# Patient Record
Sex: Female | Born: 1970 | Race: White | Hispanic: No | Marital: Married | Smoking: Former smoker
Health system: Southern US, Community
[De-identification: ages and names within clinical notes are randomized; demographics above are authoritative.]

## PROBLEM LIST (undated history)

## (undated) DIAGNOSIS — F988 Other specified behavioral and emotional disorders with onset usually occurring in childhood and adolescence: Secondary | ICD-10-CM

## (undated) HISTORY — DX: Other specified behavioral and emotional disorders with onset usually occurring in childhood and adolescence: F98.8

## (undated) HISTORY — PX: TONSILLECTOMY: SUR1361

---

## 1999-02-21 ENCOUNTER — Encounter: Payer: Self-pay | Admitting: Emergency Medicine

## 1999-02-21 ENCOUNTER — Emergency Department (HOSPITAL_COMMUNITY): Admission: EM | Admit: 1999-02-21 | Discharge: 1999-02-21 | Payer: Self-pay | Admitting: Emergency Medicine

## 1999-02-22 ENCOUNTER — Emergency Department (HOSPITAL_COMMUNITY): Admission: EM | Admit: 1999-02-22 | Discharge: 1999-02-22 | Payer: Self-pay | Admitting: Emergency Medicine

## 1999-10-07 ENCOUNTER — Other Ambulatory Visit: Admission: RE | Admit: 1999-10-07 | Discharge: 1999-10-07 | Payer: Self-pay | Admitting: Obstetrics and Gynecology

## 1999-11-08 ENCOUNTER — Other Ambulatory Visit: Admission: RE | Admit: 1999-11-08 | Discharge: 1999-11-08 | Payer: Self-pay | Admitting: Obstetrics and Gynecology

## 2000-04-19 ENCOUNTER — Other Ambulatory Visit: Admission: RE | Admit: 2000-04-19 | Discharge: 2000-04-19 | Payer: Self-pay | Admitting: Obstetrics and Gynecology

## 2000-11-03 ENCOUNTER — Other Ambulatory Visit: Admission: RE | Admit: 2000-11-03 | Discharge: 2000-11-03 | Payer: Self-pay | Admitting: *Deleted

## 2002-11-14 ENCOUNTER — Other Ambulatory Visit: Admission: RE | Admit: 2002-11-14 | Discharge: 2002-11-14 | Payer: Self-pay | Admitting: Obstetrics and Gynecology

## 2003-12-12 ENCOUNTER — Other Ambulatory Visit: Admission: RE | Admit: 2003-12-12 | Discharge: 2003-12-12 | Payer: Self-pay | Admitting: Obstetrics and Gynecology

## 2004-11-28 ENCOUNTER — Ambulatory Visit: Payer: Self-pay | Admitting: Internal Medicine

## 2005-03-06 ENCOUNTER — Ambulatory Visit: Payer: Self-pay | Admitting: Internal Medicine

## 2006-04-08 ENCOUNTER — Ambulatory Visit: Payer: Self-pay | Admitting: Internal Medicine

## 2006-04-09 ENCOUNTER — Ambulatory Visit: Payer: Self-pay | Admitting: Internal Medicine

## 2006-04-15 ENCOUNTER — Ambulatory Visit: Payer: Self-pay | Admitting: Internal Medicine

## 2006-09-28 ENCOUNTER — Ambulatory Visit (HOSPITAL_COMMUNITY): Admission: RE | Admit: 2006-09-28 | Discharge: 2006-09-28 | Payer: Self-pay | Admitting: Obstetrics and Gynecology

## 2006-10-29 ENCOUNTER — Inpatient Hospital Stay (HOSPITAL_COMMUNITY): Admission: AD | Admit: 2006-10-29 | Discharge: 2006-10-29 | Payer: Self-pay | Admitting: Obstetrics and Gynecology

## 2007-02-06 ENCOUNTER — Inpatient Hospital Stay (HOSPITAL_COMMUNITY): Admission: AD | Admit: 2007-02-06 | Discharge: 2007-02-06 | Payer: Self-pay | Admitting: Obstetrics and Gynecology

## 2007-08-19 ENCOUNTER — Encounter: Payer: Self-pay | Admitting: Internal Medicine

## 2007-10-13 ENCOUNTER — Ambulatory Visit: Payer: Self-pay | Admitting: Internal Medicine

## 2007-10-13 ENCOUNTER — Encounter: Payer: Self-pay | Admitting: Internal Medicine

## 2007-10-13 DIAGNOSIS — F988 Other specified behavioral and emotional disorders with onset usually occurring in childhood and adolescence: Secondary | ICD-10-CM | POA: Insufficient documentation

## 2007-10-17 LAB — CONVERTED CEMR LAB
ALT: 14 units/L (ref 0–35)
AST: 24 units/L (ref 0–37)
Albumin: 3.7 g/dL (ref 3.5–5.2)
Alkaline Phosphatase: 54 units/L (ref 39–117)
BUN: 4 mg/dL — ABNORMAL LOW (ref 6–23)
Bacteria, UA: NEGATIVE
Basophils Absolute: 0 10*3/uL (ref 0.0–0.1)
Basophils Relative: 0.6 % (ref 0.0–1.0)
Bilirubin Urine: NEGATIVE
Bilirubin, Direct: 0.1 mg/dL (ref 0.0–0.3)
CO2: 32 meq/L (ref 19–32)
Calcium: 9.2 mg/dL (ref 8.4–10.5)
Chloride: 102 meq/L (ref 96–112)
Creatinine, Ser: 0.6 mg/dL (ref 0.4–1.2)
Crystals: NEGATIVE
Eosinophils Absolute: 0.1 10*3/uL (ref 0.0–0.6)
Eosinophils Relative: 1.9 % (ref 0.0–5.0)
GFR calc Af Amer: 147 mL/min
GFR calc non Af Amer: 122 mL/min
Glucose, Bld: 101 mg/dL — ABNORMAL HIGH (ref 70–99)
HCT: 40.5 % (ref 36.0–46.0)
Hemoglobin, Urine: NEGATIVE
Hemoglobin: 14.1 g/dL (ref 12.0–15.0)
Ketones, ur: NEGATIVE mg/dL
Leukocytes, UA: NEGATIVE
Lymphocytes Relative: 34.9 % (ref 12.0–46.0)
MCHC: 34.9 g/dL (ref 30.0–36.0)
MCV: 90 fL (ref 78.0–100.0)
Monocytes Absolute: 0.5 10*3/uL (ref 0.2–0.7)
Monocytes Relative: 10.1 % (ref 3.0–11.0)
Mucus, UA: NEGATIVE
Neutro Abs: 2.6 10*3/uL (ref 1.4–7.7)
Neutrophils Relative %: 52.5 % (ref 43.0–77.0)
Nitrite: NEGATIVE
Platelets: 226 10*3/uL (ref 150–400)
Potassium: 4 meq/L (ref 3.5–5.1)
RBC: 4.51 M/uL (ref 3.87–5.11)
RDW: 12.2 % (ref 11.5–14.6)
Sodium: 140 meq/L (ref 135–145)
Specific Gravity, Urine: 1.01 (ref 1.000–1.03)
TSH: 1.5 microintl units/mL (ref 0.35–5.50)
Total Bilirubin: 0.6 mg/dL (ref 0.3–1.2)
Total Protein, Urine: NEGATIVE mg/dL
Total Protein: 6.7 g/dL (ref 6.0–8.3)
Urine Glucose: NEGATIVE mg/dL
Urobilinogen, UA: 0.2 (ref 0.0–1.0)
WBC: 4.9 10*3/uL (ref 4.5–10.5)
pH: 7.5 (ref 5.0–8.0)

## 2008-04-16 IMAGING — RF DG HYSTEROGRAM
6 series · 6 of 6 positions shown · IV contrast (omnipaque)
Comparison: none

CLINICAL DATA: Infertility.  
HYSTEROSALPINGOGRAM:
TECHNIQUE: Following cleansing of the cervix and vagina with Betadine, a hysterosalpingogram catheter was placed into the endocervical canal.  Hysterosalpingogram was then performed using 20 CC Omnipaque 300 contrast.

[Series 1: run · 1 of 1 slices shown (1 of 6)]
[im 1/1]
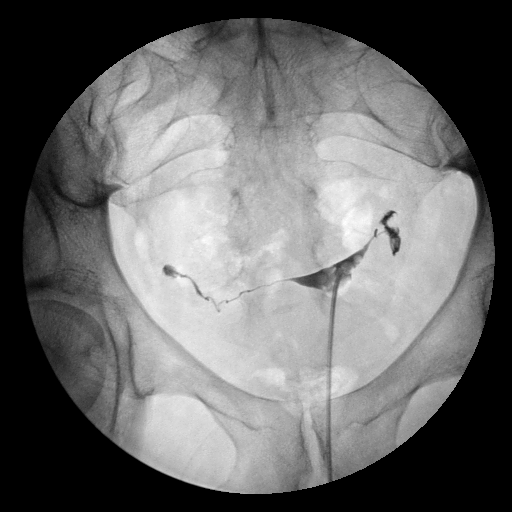

[Series 2: run · 1 of 1 slices shown (2 of 6)]
[im 1/1]
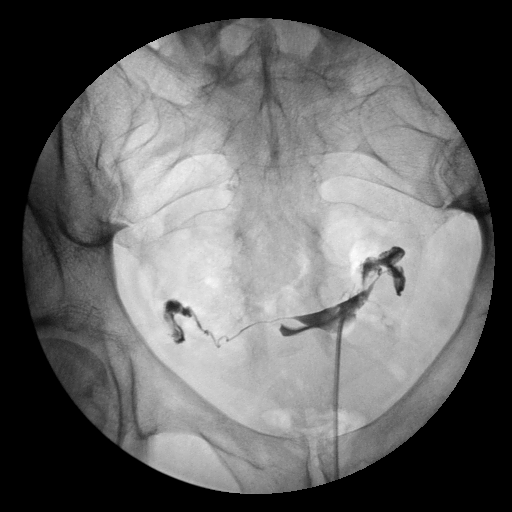

[Series 3: run · 1 of 1 slices shown (3 of 6)]
[im 1/1]
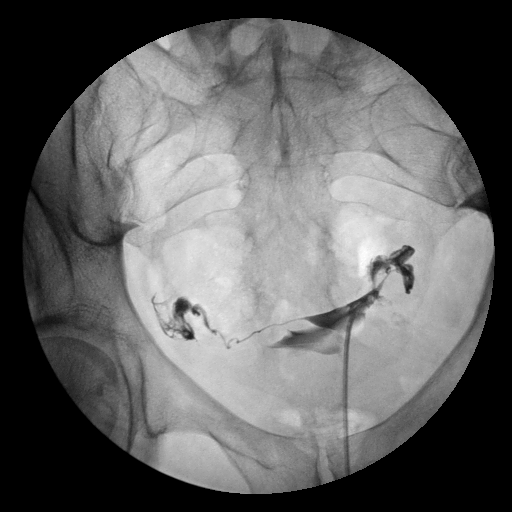

[Series 4: run · 1 of 1 slices shown (4 of 6)]
[im 1/1]
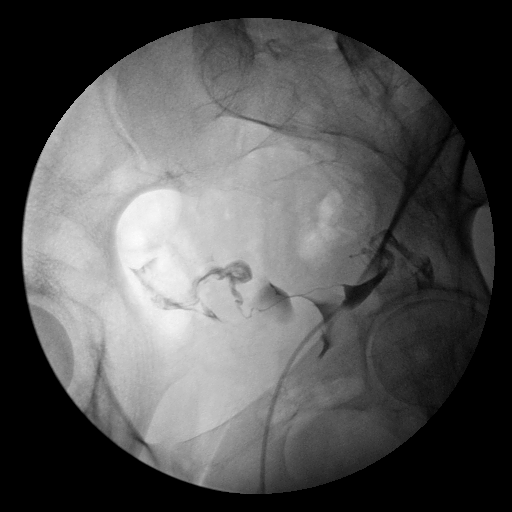

[Series 5: run · 1 of 1 slices shown (5 of 6)]
[im 1/1]
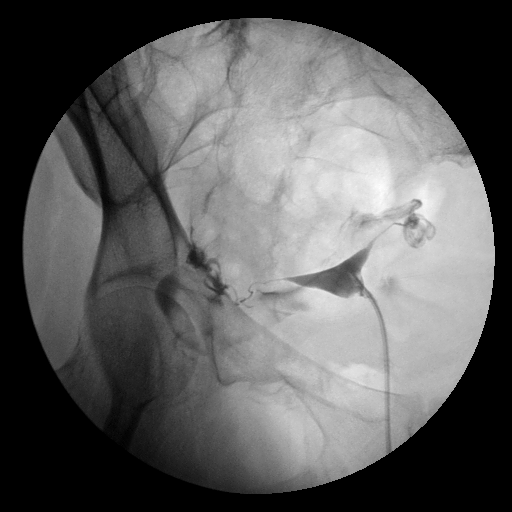

[Series 6: run · 1 of 1 slices shown (6 of 6)]
[im 1/1]
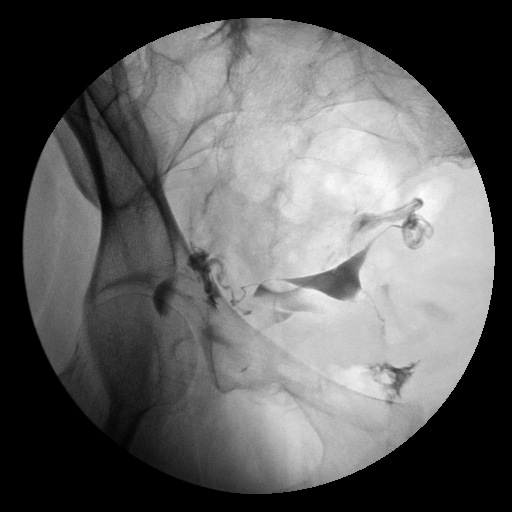

[6 of 6 positions shown; findings below may reference images not displayed]

FINDINGS: The uterus has a normal size, shape, and appearance.  No fixed filling defects are seen within the uterine cavity.  Both Fallopian tubes have a normal appearance and there is prompt free spill of contrast into both adnexal regions.  Numerous mobile air bubbles are seen within the uterus.
IMPRESSION: Normal HSG.

## 2009-09-12 ENCOUNTER — Encounter: Admission: RE | Admit: 2009-09-12 | Discharge: 2009-09-12 | Payer: Self-pay | Admitting: Obstetrics and Gynecology

## 2010-03-18 ENCOUNTER — Telehealth: Payer: Self-pay | Admitting: Internal Medicine

## 2010-03-19 ENCOUNTER — Ambulatory Visit: Payer: Self-pay | Admitting: Internal Medicine

## 2010-03-24 LAB — CONVERTED CEMR LAB
ALT: 15 units/L (ref 0–35)
AST: 20 units/L (ref 0–37)
Albumin: 3.8 g/dL (ref 3.5–5.2)
Alkaline Phosphatase: 47 units/L (ref 39–117)
BUN: 7 mg/dL (ref 6–23)
Basophils Absolute: 0 10*3/uL (ref 0.0–0.1)
Basophils Relative: 0.4 % (ref 0.0–3.0)
Bilirubin Urine: NEGATIVE
Bilirubin, Direct: 0.2 mg/dL (ref 0.0–0.3)
CO2: 30 meq/L (ref 19–32)
Calcium: 8.9 mg/dL (ref 8.4–10.5)
Chloride: 103 meq/L (ref 96–112)
Cholesterol: 169 mg/dL (ref 0–200)
Creatinine, Ser: 0.8 mg/dL (ref 0.4–1.2)
Eosinophils Absolute: 0.1 10*3/uL (ref 0.0–0.7)
Eosinophils Relative: 1.8 % (ref 0.0–5.0)
GFR calc non Af Amer: 85.74 mL/min (ref >60)
Glucose, Bld: 89 mg/dL (ref 70–99)
HCT: 40.9 % (ref 36.0–46.0)
HDL: 84.9 mg/dL (ref >39.00)
Hemoglobin: 14.1 g/dL (ref 12.0–15.0)
Ketones, ur: NEGATIVE mg/dL
LDL Cholesterol: 76 mg/dL (ref 0–99)
Leukocytes, UA: NEGATIVE
Lymphocytes Relative: 22.6 % (ref 12.0–46.0)
Lymphs Abs: 1.2 10*3/uL (ref 0.7–4.0)
MCHC: 34.6 g/dL (ref 30.0–36.0)
MCV: 91.8 fL (ref 78.0–100.0)
Monocytes Absolute: 0.4 10*3/uL (ref 0.1–1.0)
Monocytes Relative: 7.4 % (ref 3.0–12.0)
Neutro Abs: 3.6 10*3/uL (ref 1.4–7.7)
Neutrophils Relative %: 67.8 % (ref 43.0–77.0)
Nitrite: NEGATIVE
Platelets: 205 10*3/uL (ref 150.0–400.0)
Potassium: 4.2 meq/L (ref 3.5–5.1)
RBC: 4.46 M/uL (ref 3.87–5.11)
RDW: 13.5 % (ref 11.5–14.6)
Sodium: 138 meq/L (ref 135–145)
Specific Gravity, Urine: 1.01 (ref 1.000–1.030)
TSH: 1.55 microintl units/mL (ref 0.35–5.50)
Total Bilirubin: 0.6 mg/dL (ref 0.3–1.2)
Total CHOL/HDL Ratio: 2
Total Protein, Urine: NEGATIVE mg/dL
Total Protein: 6.5 g/dL (ref 6.0–8.3)
Triglycerides: 42 mg/dL (ref 0.0–149.0)
Urine Glucose: NEGATIVE mg/dL
Urobilinogen, UA: 0.2 (ref 0.0–1.0)
VLDL: 8.4 mg/dL (ref 0.0–40.0)
WBC: 5.3 10*3/uL (ref 4.5–10.5)
pH: 6 (ref 5.0–8.0)

## 2010-04-08 ENCOUNTER — Ambulatory Visit: Payer: Self-pay | Admitting: Internal Medicine

## 2010-04-08 DIAGNOSIS — R209 Unspecified disturbances of skin sensation: Secondary | ICD-10-CM

## 2010-10-27 ENCOUNTER — Telehealth: Payer: Self-pay | Admitting: Internal Medicine

## 2010-10-28 ENCOUNTER — Encounter: Payer: Self-pay | Admitting: Internal Medicine

## 2010-10-30 ENCOUNTER — Encounter: Payer: Self-pay | Admitting: Internal Medicine

## 2010-12-02 ENCOUNTER — Encounter: Payer: Self-pay | Admitting: Internal Medicine

## 2011-01-04 ENCOUNTER — Encounter: Payer: Self-pay | Admitting: Interventional Radiology

## 2011-01-15 NOTE — Assessment & Plan Note (Signed)
Summary: PHYSICAL-STC  #   Vital Signs:  Patient profile:   40 year old female Height:      69 inches Weight:      138.25 pounds (62.84 kg) BMI:     20.49 O2 Sat:      98 % on Room air Temp:     99.3 degrees F (37.39 degrees C) oral Pulse rate:   95 / minute Pulse rhythm:   regular BP sitting:   120 / 74  Vitals Entered ByMorrie Sheldon Johnson(April 08, 2010 3:10 PM)  O2 Flow:  Room air CC: CPX/pt states she receive tetanus within last 2 years at her ob/gyn/eye exam in 2010 was normal/aj  Vision Screening:      Vision Comments: Last eye exam was normal in 2010 at [facility]   CC:  CPX/pt states she receive tetanus within last 2 years at her ob/gyn/eye exam in 2010 was normal/aj.  History of Present Illness: The patient presents for a wellness examination  C/o cold and tingly feet lasting for hrs x 1-2  years - daily event  Current Medications (verified): 1)  Adderall Xr 10 Mg  Cp24 (Amphetamine-Dextroamphetamine) .... Once Daily 2)  Wellbutrin Sr 150 Mg  Tb12 (Bupropion Hcl) .... Once Daily 3)  Bcp .... Once Daily  Allergies (verified): 1)  ! 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    Physical Exam  General:  Well-developed,well-nourished,in no acute distress; alert,appropriate and cooperative throughout examination Head:  Normocephalic and atraumatic without obvious abnormalities. No apparent alopecia or balding. Eyes:  No corneal or conjunctival inflammation noted. EOMI. Perrla. Ears:  External ear exam shows no significant lesions or deformities.  Otoscopic examination reveals clear canals, tympanic membranes are intact bilaterally without bulging, retraction, inflammation or discharge. Hearing is grossly normal bilaterally. Nose:  External nasal examination shows no deformity or inflammation. Nasal mucosa are pink and moist without lesions or exudates. Mouth:  Oral mucosa and oropharynx without lesions or exudates.  Teeth in good repair. Neck:  No deformities, masses, or tenderness noted. Lungs:  Normal respiratory effort, chest expands symmetrically. Lungs are clear to auscultation, no crackles or wheezes. Heart:  Normal rate and regular rhythm. S1 and S2 normal without gallop, murmur, click, rub or other extra sounds. Abdomen:  Bowel sounds positive,abdomen soft and non-tender without masses, organomegaly or hernias noted. Msk:  No deformity or scoliosis noted of thoracic or lumbar spine.   Pulses:  R and L carotid,radial,femoral,dorsalis pedis and posterior tibial pulses are full and equal bilaterally Extremities:  No clubbing, cyanosis, edema, or deformity noted with normal full range of motion of all joints.   Neurologic:  No cranial nerve deficits noted. Station and gait are normal. Plantar reflexes are down-going bilaterally. DTRs are symmetrical throughout. Sensory, motor and coordinative  functions appear intact. Skin:  Intact without suspicious lesions or rashes Cervical Nodes:  No lymphadenopathy noted Inguinal  Nodes:  No significant adenopathy Psych:  Cognition and judgment appear intact. Alert and cooperative with normal attention span and concentration. No apparent delusions, illusions, hallucinations   Impression & Recommendations:  Problem # 1:  PHYSICAL EXAMINATION (ICD-V70.0) Assessment New Health and age related issues were discussed. Available screening tests and vaccinations were discussed as well. Healthy life style including good diet and execise was discussed. The labs were reviewed with the patient.  GYN q 12 months   Problem # 2:  A D D (ICD-314.00) Assessment: Unchanged On prescription  therapy   Problem # 3:  PARESTHESIA (ICD-782.0) Assessment: Deteriorated Poss idiopathic. She has been on 1 MVI a day - doubt B12 def Heating rubs, diabetic socks, heating pad/rice bag Consider Aggrenox, Trental, Neurontin Call if you are not better in a reasonable amount of time or if worse. Go to ER if feeling really bad!   Complete Medication List: 1)  Adderall Xr 10 Mg Cp24 (Amphetamine-dextroamphetamine) .... Once daily 2)  Wellbutrin Sr 150 Mg Tb12 (Bupropion hcl) .... Once daily 3)  Zolpidem Tartrate 10 Mg Tabs (Zolpidem tartrate) .... 1/2-1 tab at bedtime as needed insomnia 4)  Bcp  .... Once daily 5)  Vitamin D 1000 Unit Tabs (Cholecalciferol) .Marland Kitchen.. 1 by mouth qd 6)  Aspirin 81 Mg Tbec (Aspirin) .Marland Kitchen.. 1 by mouth qd  Patient Instructions: 1)  Please schedule a follow-up appointment in 1 year well w/labs and Vit B12, Vit d and ESR v70.0 782.0 2)  Can try Niacin Prescriptions: ASPIRIN 81 MG TBEC (ASPIRIN) 1 by mouth qd  #100 x 3   Entered and Authorized by:   Tresa Garter MD   Signed by:   Tresa Garter MD on 04/08/2010   Method used:   Print then Give to Patient   RxID:   [id number] VITAMIN D 1000 UNIT TABS (CHOLECALCIFEROL) 1 by mouth qd  #200 x 4   Entered and Authorized by:   Tresa Garter MD   Signed by:   Tresa Garter MD on 04/08/2010    Method used:   Print then Give to Patient   RxID:   [id number] ZOLPIDEM TARTRATE 10 MG TABS (ZOLPIDEM TARTRATE) 1/2-1 tab at bedtime as needed insomnia  #30 x 6   Entered and Authorized by:   Tresa Garter MD   Signed by:   Tresa Garter MD on 04/08/2010   Method used:   Print then Give to Patient   RxID:   [id number]

## 2011-01-15 NOTE — Letter (Signed)
Summary: [facility]  [facility]   Imported By: Sherian Rein 12/25/2010 14:35:33  _____________________________________________________________________  External Attachment:    Type:   Image     Comment:   External Document

## 2011-01-15 NOTE — Progress Notes (Signed)
Summary: Labs  Phone Note Call from Patient   Summary of Call: Pt has office visit scheduled for 4/26, she will be in [place] tomorrow and would like to have her labs early. Is this ok? Lab is already in idx.  Initial call taken by: Lamar Sprinkles, CMA,  March 18, 2010 8:35 AM  Follow-up for Phone Call        OK Thx Follow-up by: Tresa Garter MD,  March 18, 2010 1:14 PM  Additional Follow-up for Phone Call Additional follow up Details #1::        Left vm for pt to come to lab tomorrow as requested Additional Follow-up by: Lamar Sprinkles, CMA,  March 18, 2010 1:16 PM

## 2011-01-15 NOTE — Progress Notes (Signed)
Summary: fax #  Phone Note Call from Patient   Caller: Patient-[phone] Summary of Call: Patient called stating that she has a form that needs to be completed by MD stating when last CPX was done. She is requesting a call back to get fax number for MD..Lakisha Archie CMA  October 27, 2010 3:11 PM Initial call taken by: Rock Nephew CMA,  October 27, 2010 3:11 PM  Follow-up for Phone Call        Called pt gave fax # for Dr. Posey Rea Follow-up by: Orlan Leavens RMA,  October 27, 2010 4:08 PM

## 2011-01-15 NOTE — Letter (Signed)
Summary: [facility]  [facility]   Imported By: Sherian Rein 11/03/2010 11:04:53  _____________________________________________________________________  External Attachment:    Type:   Image     Comment:   External Document

## 2011-06-19 ENCOUNTER — Other Ambulatory Visit: Payer: Self-pay | Admitting: Internal Medicine

## 2011-06-19 ENCOUNTER — Other Ambulatory Visit: Payer: Self-pay

## 2011-06-19 DIAGNOSIS — Z0000 Encounter for general adult medical examination without abnormal findings: Secondary | ICD-10-CM

## 2011-06-24 ENCOUNTER — Encounter: Payer: Self-pay | Admitting: Internal Medicine

## 2011-08-14 ENCOUNTER — Other Ambulatory Visit: Payer: Self-pay

## 2011-08-18 ENCOUNTER — Encounter: Payer: Self-pay | Admitting: Internal Medicine

## 2011-09-11 ENCOUNTER — Other Ambulatory Visit: Payer: Self-pay | Admitting: Internal Medicine

## 2011-09-11 ENCOUNTER — Other Ambulatory Visit (INDEPENDENT_AMBULATORY_CARE_PROVIDER_SITE_OTHER): Payer: Self-pay

## 2011-09-11 DIAGNOSIS — Z0000 Encounter for general adult medical examination without abnormal findings: Secondary | ICD-10-CM

## 2011-09-11 LAB — LIPID PANEL
Cholesterol: 168 mg/dL (ref 0–200)
HDL: 91.7 mg/dL (ref >39.00)
LDL Cholesterol: 71 mg/dL (ref 0–99)
Total CHOL/HDL Ratio: 2
Triglycerides: 26 mg/dL (ref 0.0–149.0)

## 2011-09-11 LAB — BASIC METABOLIC PANEL
CO2: 26 mEq/L (ref 19–32)
Calcium: 8.2 mg/dL — ABNORMAL LOW (ref 8.4–10.5)
Chloride: 107 mEq/L (ref 96–112)
Sodium: 139 mEq/L (ref 135–145)

## 2011-09-11 LAB — HEPATIC FUNCTION PANEL
ALT: 13 U/L (ref 0–35)
AST: 18 U/L (ref 0–37)
Albumin: 3.8 g/dL (ref 3.5–5.2)

## 2011-09-11 LAB — TSH: TSH: 1.18 u[IU]/mL (ref 0.35–5.50)

## 2011-09-14 LAB — CBC WITH DIFFERENTIAL/PLATELET
Basophils Absolute: 0 10*3/uL (ref 0.0–0.1)
Hemoglobin: 14.3 g/dL (ref 12.0–15.0)
Lymphocytes Relative: 41.4 % (ref 12.0–46.0)
Monocytes Relative: 9.9 % (ref 3.0–12.0)
Platelets: 219 10*3/uL (ref 150.0–400.0)
RDW: 12.3 % (ref 11.5–14.6)

## 2011-09-14 LAB — URINALYSIS, ROUTINE W REFLEX MICROSCOPIC
Leukocytes, UA: NEGATIVE
Nitrite: NEGATIVE
Specific Gravity, Urine: <=1.005 (ref 1.000–1.030)
pH: 6 (ref 5.0–8.0)

## 2011-09-15 NOTE — Progress Notes (Signed)
Quick Note:  Voice message left on [employer] system - lab is negative, normal or otherwise stable, pt to continue same tx ______ 

## 2011-09-16 ENCOUNTER — Encounter: Payer: Self-pay | Admitting: Internal Medicine

## 2011-09-18 ENCOUNTER — Ambulatory Visit (INDEPENDENT_AMBULATORY_CARE_PROVIDER_SITE_OTHER): Payer: Managed Care, Other (non HMO) | Admitting: Internal Medicine

## 2011-09-18 ENCOUNTER — Encounter: Payer: Self-pay | Admitting: Internal Medicine

## 2011-09-18 VITALS — BP 100/70 | HR 80 | Temp 99.3°F | Resp 16 | Ht 68.5 in | Wt 141.0 lb

## 2011-09-18 DIAGNOSIS — Z23 Encounter for immunization: Secondary | ICD-10-CM

## 2011-09-18 DIAGNOSIS — Z0000 Encounter for general adult medical examination without abnormal findings: Secondary | ICD-10-CM

## 2011-09-18 NOTE — Progress Notes (Signed)
  Subjective:    Patient ID: Victoria Hart, female    DOB: 06/24/1971, 40 y.o.   MRN: [id number]  HPI  The patient is here for a wellness exam. The patient has been doing well overall without major physical  issues going on lately. She has been grieving (her father died of liver ca)  Review of Systems  Constitutional: Negative for fever, chills, diaphoresis, activity change, appetite change, fatigue and unexpected weight change.  HENT: Negative for hearing loss, ear pain, congestion, sore throat, sneezing, mouth sores, neck pain, dental problem, voice change, postnasal drip and sinus pressure.   Eyes: Negative for pain and visual disturbance.  Respiratory: Negative for cough, chest tightness, wheezing and stridor.   Cardiovascular: Negative for chest pain, palpitations and leg swelling.  Gastrointestinal: Negative for nausea, vomiting, abdominal pain, blood in stool, abdominal distention and rectal pain.  Genitourinary: Negative for dysuria, hematuria, decreased urine volume, vaginal bleeding, vaginal discharge, difficulty urinating, vaginal pain and menstrual problem.  Musculoskeletal: Negative for back pain, joint swelling and gait problem.  Skin: Negative for color change, rash and wound.  Neurological: Negative for dizziness, tremors, syncope, speech difficulty and light-headedness.  Hematological: Negative for adenopathy.  Psychiatric/Behavioral: Positive for sleep disturbance. Negative for suicidal ideas, hallucinations, behavioral problems, confusion, dysphoric mood and decreased concentration. The patient is not hyperactive.        Objective:   Physical Exam  Constitutional: She appears well-developed and well-nourished. No distress.  HENT:  Head: Normocephalic.  Right Ear: External ear normal.  Left Ear: External ear normal.  Nose: Nose normal.  Mouth/Throat: Oropharynx is clear and moist.  Eyes: Conjunctivae are normal. Pupils are equal, round, and reactive to light. Right  eye exhibits no discharge. Left eye exhibits no discharge.  Neck: Normal range of motion. Neck supple. No JVD present. No tracheal deviation present. No thyromegaly present.  Cardiovascular: Normal rate, regular rhythm and normal heart sounds.   Pulmonary/Chest: No stridor. No respiratory distress. She has no wheezes.  Abdominal: Soft. Bowel sounds are normal. She exhibits no distension and no mass. There is no tenderness. There is no rebound and no guarding.  Musculoskeletal: She exhibits no edema and no tenderness.  Lymphadenopathy:    She has no cervical adenopathy.  Neurological: She displays normal reflexes. No cranial nerve deficit. She exhibits normal muscle tone. Coordination normal.  Skin: No rash noted. No erythema.  Psychiatric: She has a normal mood and affect. Her behavior is normal. Judgment and thought content normal.     Lab Results  Component Value Date   WBC 6.1 09/11/2011   HGB 14.3 09/11/2011   HCT 41.8 09/11/2011   PLT 219.0 09/11/2011   GLUCOSE 94 09/11/2011   CHOL 168 09/11/2011   TRIG 26.0 09/11/2011   HDL 91.70 09/11/2011   LDLCALC 71 09/11/2011   ALT 13 09/11/2011   AST 18 09/11/2011   NA 139 09/11/2011   K 4.2 09/11/2011   CL 107 09/11/2011   CREATININE 0.9 09/11/2011   BUN 15 09/11/2011   CO2 26 09/11/2011   TSH 1.18 09/11/2011        Assessment & Plan:

## 2011-09-18 NOTE — Assessment & Plan Note (Signed)
We discussed age appropriate health related issues, including available/recomended screening tests and vaccinations. We discussed a need for adhering to healthy diet and exercise. Labs/EKG were reviewed/ordered. All questions were answered.   

## 2012-08-14 LAB — HM PAP SMEAR

## 2012-11-15 ENCOUNTER — Other Ambulatory Visit: Payer: Self-pay | Admitting: Internal Medicine

## 2012-11-15 DIAGNOSIS — Z0000 Encounter for general adult medical examination without abnormal findings: Secondary | ICD-10-CM

## 2013-01-02 ENCOUNTER — Other Ambulatory Visit (INDEPENDENT_AMBULATORY_CARE_PROVIDER_SITE_OTHER): Payer: Managed Care, Other (non HMO)

## 2013-01-02 DIAGNOSIS — Z0000 Encounter for general adult medical examination without abnormal findings: Secondary | ICD-10-CM

## 2013-01-02 LAB — LIPID PANEL
Cholesterol: 178 mg/dL (ref 0–200)
HDL: 96.2 mg/dL (ref >39.00)
VLDL: 9.2 mg/dL (ref 0.0–40.0)

## 2013-01-02 LAB — URINALYSIS, ROUTINE W REFLEX MICROSCOPIC
Bilirubin Urine: NEGATIVE
Ketones, ur: NEGATIVE
Total Protein, Urine: NEGATIVE
Urine Glucose: NEGATIVE
pH: 6 (ref 5.0–8.0)

## 2013-01-02 LAB — CBC WITH DIFFERENTIAL/PLATELET
Basophils Absolute: 0 10*3/uL (ref 0.0–0.1)
HCT: 38.1 % (ref 36.0–46.0)
Hemoglobin: 12.8 g/dL (ref 12.0–15.0)
Lymphs Abs: 1.4 10*3/uL (ref 0.7–4.0)
MCHC: 33.5 g/dL (ref 30.0–36.0)
Monocytes Relative: 9.7 % (ref 3.0–12.0)
Neutro Abs: 3.6 10*3/uL (ref 1.4–7.7)
RDW: 14.2 % (ref 11.5–14.6)

## 2013-01-02 LAB — COMPREHENSIVE METABOLIC PANEL
ALT: 16 U/L (ref 0–35)
CO2: 27 mEq/L (ref 19–32)
Creatinine, Ser: 0.9 mg/dL (ref 0.4–1.2)
GFR: 78.78 mL/min (ref >60.00)
Total Bilirubin: 0.4 mg/dL (ref 0.3–1.2)

## 2013-01-02 LAB — TSH: TSH: 1.99 u[IU]/mL (ref 0.35–5.50)

## 2013-01-11 ENCOUNTER — Encounter: Payer: Managed Care, Other (non HMO) | Admitting: Internal Medicine

## 2013-02-21 ENCOUNTER — Encounter: Payer: Self-pay | Admitting: Internal Medicine

## 2013-02-21 ENCOUNTER — Ambulatory Visit (INDEPENDENT_AMBULATORY_CARE_PROVIDER_SITE_OTHER): Payer: Managed Care, Other (non HMO) | Admitting: Internal Medicine

## 2013-02-21 VITALS — BP 132/86 | HR 72 | Temp 98.1°F | Resp 16 | Ht 68.75 in | Wt 130.0 lb

## 2013-02-21 DIAGNOSIS — Z23 Encounter for immunization: Secondary | ICD-10-CM

## 2013-02-21 DIAGNOSIS — R7309 Other abnormal glucose: Secondary | ICD-10-CM

## 2013-02-21 DIAGNOSIS — F988 Other specified behavioral and emotional disorders with onset usually occurring in childhood and adolescence: Secondary | ICD-10-CM

## 2013-02-21 DIAGNOSIS — Z0000 Encounter for general adult medical examination without abnormal findings: Secondary | ICD-10-CM

## 2013-02-21 NOTE — Patient Instructions (Signed)
Wt Readings from Last 3 Encounters:  02/21/13 130 lb (58.968 kg)  09/18/11 141 lb (63.957 kg)  04/08/10 138 lb 4 oz (62.71 kg)

## 2013-02-21 NOTE — Assessment & Plan Note (Signed)
We discussed age appropriate health related issues, including available/recomended screening tests and vaccinations. We discussed a need for adhering to healthy diet and exercise. Labs/EKG were reviewed/ordered. All questions were answered. GYN care q 1 year Ophth q 2 years

## 2013-02-21 NOTE — Assessment & Plan Note (Signed)
On Rx 

## 2013-02-21 NOTE — Progress Notes (Signed)
   Subjective:    HPI  The patient is here for a wellness exam. The patient has been doing well overall without major physical  issues going on lately. She has been grieving (her father died of liver ca)  Wt Readings from Last 3 Encounters:  02/21/13 130 lb (58.968 kg)  09/18/11 141 lb (63.957 kg)  04/08/10 138 lb 4 oz (62.71 kg)   BP Readings from Last 3 Encounters:  02/21/13 132/86  09/18/11 100/70  04/08/10 120/74      Review of Systems  Constitutional: Negative for fever, chills, diaphoresis, activity change, appetite change, fatigue and unexpected weight change.  HENT: Negative for hearing loss, ear pain, congestion, sore throat, sneezing, mouth sores, neck pain, dental problem, voice change, postnasal drip and sinus pressure.   Eyes: Negative for pain and visual disturbance.  Respiratory: Negative for cough, chest tightness, wheezing and stridor.   Cardiovascular: Negative for chest pain, palpitations and leg swelling.  Gastrointestinal: Negative for nausea, vomiting, abdominal pain, blood in stool, abdominal distention and rectal pain.  Genitourinary: Negative for dysuria, hematuria, decreased urine volume, vaginal bleeding, vaginal discharge, difficulty urinating, vaginal pain and menstrual problem.  Musculoskeletal: Negative for back pain, joint swelling and gait problem.  Skin: Negative for color change, rash and wound.  Neurological: Negative for dizziness, tremors, syncope, speech difficulty and light-headedness.  Hematological: Negative for adenopathy.  Psychiatric/Behavioral: Positive for sleep disturbance. Negative for suicidal ideas, hallucinations, behavioral problems, confusion, dysphoric mood and decreased concentration. The patient is not hyperactive.        Objective:   Physical Exam  Constitutional: She appears well-developed and well-nourished. No distress.  HENT:  Head: Normocephalic.  Right Ear: External ear normal.  Left Ear: External ear normal.   Nose: Nose normal.  Mouth/Throat: Oropharynx is clear and moist.  Eyes: Conjunctivae are normal. Pupils are equal, round, and reactive to light. Right eye exhibits no discharge. Left eye exhibits no discharge.  Neck: Normal range of motion. Neck supple. No JVD present. No tracheal deviation present. No thyromegaly present.  Cardiovascular: Normal rate, regular rhythm and normal heart sounds.   Pulmonary/Chest: No stridor. No respiratory distress. She has no wheezes.  Abdominal: Soft. Bowel sounds are normal. She exhibits no distension and no mass. There is no tenderness. There is no rebound and no guarding.  Musculoskeletal: She exhibits no edema and no tenderness.  Lymphadenopathy:    She has no cervical adenopathy.  Neurological: She displays normal reflexes. No cranial nerve deficit. She exhibits normal muscle tone. Coordination normal.  Skin: No rash noted. No erythema.  Psychiatric: She has a normal mood and affect. Her behavior is normal. Judgment and thought content normal.     Lab Results  Component Value Date   WBC 5.6 01/02/2013   HGB 12.8 01/02/2013   HCT 38.1 01/02/2013   PLT 244.0 01/02/2013   GLUCOSE 104* 01/02/2013   CHOL 178 01/02/2013   TRIG 46.0 01/02/2013   HDL 96.20 01/02/2013   LDLCALC 73 01/02/2013   ALT 16 01/02/2013   AST 23 01/02/2013   NA 137 01/02/2013   K 4.0 01/02/2013   CL 103 01/02/2013   CREATININE 0.9 01/02/2013   BUN 12 01/02/2013   CO2 27 01/02/2013   TSH 1.99 01/02/2013        Assessment & Plan:

## 2013-02-21 NOTE — Assessment & Plan Note (Signed)
BMET in 12 mo

## 2014-03-13 ENCOUNTER — Ambulatory Visit (INDEPENDENT_AMBULATORY_CARE_PROVIDER_SITE_OTHER): Payer: Managed Care, Other (non HMO) | Admitting: Internal Medicine

## 2014-03-13 ENCOUNTER — Encounter: Payer: Self-pay | Admitting: Internal Medicine

## 2014-03-13 VITALS — BP 118/74 | HR 80 | Temp 98.5°F | Resp 16 | Ht 68.5 in | Wt 138.0 lb

## 2014-03-13 DIAGNOSIS — F988 Other specified behavioral and emotional disorders with onset usually occurring in childhood and adolescence: Secondary | ICD-10-CM

## 2014-03-13 DIAGNOSIS — Z0000 Encounter for general adult medical examination without abnormal findings: Secondary | ICD-10-CM

## 2014-03-13 MED ORDER — LORATADINE 10 MG PO TABS: 10.0000 mg | ORAL_TABLET | Freq: Every day | ORAL | Status: AC

## 2014-03-13 MED ORDER — CHOLECALCIFEROL 25 MCG (1000 UT) PO TABS: 1000.0000 [IU] | ORAL_TABLET | Freq: Every day | ORAL | Status: AC

## 2014-03-13 MED ORDER — LORATADINE 10 MG PO TABS: 10.0000 mg | ORAL_TABLET | Freq: Every day | ORAL | Status: DC

## 2014-03-13 NOTE — Assessment & Plan Note (Signed)
We discussed age appropriate health related issues, including available/recomended screening tests and vaccinations. We discussed a need for adhering to healthy diet and exercise. Labs/EKG were reviewed/ordered. All questions were answered.   

## 2014-03-13 NOTE — Progress Notes (Signed)
Pre visit review using our clinic review tool, if applicable. No additional management support is needed unless otherwise documented below in the visit note. 

## 2014-03-13 NOTE — Assessment & Plan Note (Signed)
Continue with current prescription therapy as reflected on the Med list.  

## 2014-03-13 NOTE — Progress Notes (Signed)
   Subjective:    HPI  The patient is here for a wellness exam. The patient has been doing well overall without major physical  issues going on lately. She has been grieving (her father died of liver ca) - in 2014   Wt Readings from Last 3 Encounters:  03/13/14 138 lb (62.596 kg)  02/21/13 130 lb (58.968 kg)  09/18/11 141 lb (63.957 kg)   BP Readings from Last 3 Encounters:  03/13/14 118/74  02/21/13 132/86  09/18/11 100/70      Review of Systems  Constitutional: Negative for fever, chills, diaphoresis, activity change, appetite change, fatigue and unexpected weight change.  HENT: Negative for congestion, dental problem, ear pain, hearing loss, mouth sores, postnasal drip, sinus pressure, sneezing, sore throat and voice change.   Eyes: Negative for pain and visual disturbance.  Respiratory: Negative for cough, chest tightness, wheezing and stridor.   Cardiovascular: Negative for chest pain, palpitations and leg swelling.  Gastrointestinal: Negative for nausea, vomiting, abdominal pain, blood in stool, abdominal distention and rectal pain.  Genitourinary: Negative for dysuria, hematuria, decreased urine volume, vaginal bleeding, vaginal discharge, difficulty urinating, vaginal pain and menstrual problem.  Musculoskeletal: Negative for back pain, gait problem, joint swelling and neck pain.  Skin: Negative for color change, rash and wound.  Neurological: Negative for dizziness, tremors, syncope, speech difficulty and light-headedness.  Hematological: Negative for adenopathy.  Psychiatric/Behavioral: Positive for sleep disturbance. Negative for suicidal ideas, hallucinations, behavioral problems, confusion, dysphoric mood and decreased concentration. The patient is not hyperactive.        Objective:   Physical Exam  Constitutional: She appears well-developed and well-nourished. No distress.  HENT:  Head: Normocephalic.  Right Ear: External ear normal.  Left Ear: External ear  normal.  Nose: Nose normal.  Mouth/Throat: Oropharynx is clear and moist.  Eyes: Conjunctivae are normal. Pupils are equal, round, and reactive to light. Right eye exhibits no discharge. Left eye exhibits no discharge.  Neck: Normal range of motion. Neck supple. No JVD present. No tracheal deviation present. No thyromegaly present.  Cardiovascular: Normal rate, regular rhythm and normal heart sounds.   Pulmonary/Chest: No stridor. No respiratory distress. She has no wheezes.  Abdominal: Soft. Bowel sounds are normal. She exhibits no distension and no mass. There is no tenderness. There is no rebound and no guarding.  Musculoskeletal: She exhibits no edema and no tenderness.  Lymphadenopathy:    She has no cervical adenopathy.  Neurological: She displays normal reflexes. No cranial nerve deficit. She exhibits normal muscle tone. Coordination normal.  Skin: No rash noted. No erythema.  Psychiatric: She has a normal mood and affect. Her behavior is normal. Judgment and thought content normal.     Lab Results  Component Value Date   WBC 5.6 01/02/2013   HGB 12.8 01/02/2013   HCT 38.1 01/02/2013   PLT 244.0 01/02/2013   GLUCOSE 104* 01/02/2013   CHOL 178 01/02/2013   TRIG 46.0 01/02/2013   HDL 96.20 01/02/2013   LDLCALC 73 01/02/2013   ALT 16 01/02/2013   AST 23 01/02/2013   NA 137 01/02/2013   K 4.0 01/02/2013   CL 103 01/02/2013   CREATININE 0.9 01/02/2013   BUN 12 01/02/2013   CO2 27 01/02/2013   TSH 1.99 01/02/2013        Assessment & Plan:

## 2014-03-20 ENCOUNTER — Other Ambulatory Visit (INDEPENDENT_AMBULATORY_CARE_PROVIDER_SITE_OTHER): Payer: Managed Care, Other (non HMO)

## 2014-03-20 DIAGNOSIS — Z0000 Encounter for general adult medical examination without abnormal findings: Secondary | ICD-10-CM

## 2014-03-20 LAB — CBC WITH DIFFERENTIAL/PLATELET
BASOS PCT: 0.7 % (ref 0.0–3.0)
Basophils Absolute: 0 10*3/uL (ref 0.0–0.1)
EOS PCT: 1.8 % (ref 0.0–5.0)
Eosinophils Absolute: 0.1 10*3/uL (ref 0.0–0.7)
HCT: 39.5 % (ref 36.0–46.0)
Hemoglobin: 13.4 g/dL (ref 12.0–15.0)
LYMPHS PCT: 27.6 % (ref 12.0–46.0)
Lymphs Abs: 1.3 10*3/uL (ref 0.7–4.0)
MCHC: 34 g/dL (ref 30.0–36.0)
MCV: 90.1 fl (ref 78.0–100.0)
Monocytes Absolute: 0.4 10*3/uL (ref 0.1–1.0)
Monocytes Relative: 9.3 % (ref 3.0–12.0)
NEUTROS ABS: 2.9 10*3/uL (ref 1.4–7.7)
Neutrophils Relative %: 60.6 % (ref 43.0–77.0)
Platelets: 257 10*3/uL (ref 150.0–400.0)
RBC: 4.39 Mil/uL (ref 3.87–5.11)
RDW: 12.7 % (ref 11.5–14.6)
WBC: 4.7 10*3/uL (ref 4.5–10.5)

## 2014-03-20 LAB — URINALYSIS, ROUTINE W REFLEX MICROSCOPIC
Bilirubin Urine: NEGATIVE
Ketones, ur: NEGATIVE
Leukocytes, UA: NEGATIVE
NITRITE: NEGATIVE
PH: 6 (ref 5.0–8.0)
Specific Gravity, Urine: 1.02 (ref 1.000–1.030)
Total Protein, Urine: NEGATIVE
UROBILINOGEN UA: 0.2 (ref 0.0–1.0)
Urine Glucose: NEGATIVE

## 2014-03-20 LAB — LIPID PANEL
Cholesterol: 196 mg/dL (ref 0–200)
HDL: 105.3 mg/dL (ref >39.00)
LDL CALC: 86 mg/dL (ref 0–99)
Total CHOL/HDL Ratio: 2
Triglycerides: 25 mg/dL (ref 0.0–149.0)
VLDL: 5 mg/dL (ref 0.0–40.0)

## 2014-03-20 LAB — HEPATIC FUNCTION PANEL
ALBUMIN: 3.5 g/dL (ref 3.5–5.2)
ALT: 16 U/L (ref 0–35)
AST: 21 U/L (ref 0–37)
Alkaline Phosphatase: 48 U/L (ref 39–117)
BILIRUBIN TOTAL: 0.6 mg/dL (ref 0.3–1.2)
Bilirubin, Direct: 0 mg/dL (ref 0.0–0.3)
Total Protein: 6.3 g/dL (ref 6.0–8.3)

## 2014-03-20 LAB — BASIC METABOLIC PANEL
BUN: 9 mg/dL (ref 6–23)
CO2: 28 meq/L (ref 19–32)
Calcium: 8.7 mg/dL (ref 8.4–10.5)
Chloride: 103 mEq/L (ref 96–112)
Creatinine, Ser: 0.8 mg/dL (ref 0.4–1.2)
GFR: 79.38 mL/min (ref >60.00)
Glucose, Bld: 99 mg/dL (ref 70–99)
Potassium: 4.1 mEq/L (ref 3.5–5.1)
Sodium: 136 mEq/L (ref 135–145)

## 2014-03-20 LAB — TSH: TSH: 1.26 u[IU]/mL (ref 0.35–5.50)

## 2014-07-25 ENCOUNTER — Telehealth: Payer: Self-pay | Admitting: Internal Medicine

## 2014-07-25 NOTE — Telephone Encounter (Signed)
Patient Information:  Caller Name: Natalia LeatherwoodKatherine  Phone: [phone]  Patient: Victoria Hart, Victoria Hart  Gender: Female  DOB: 08/04/1971  Age: 43 Years  PCP: Plotnikov, Alex (Adults only)  Pregnant: No  Office Follow Up:  Does the office need to follow up with this patient?: No  Instructions For The Office: N/A  RN Note:  Sting was to the top of the hand and hand is swollen, red, and swelling has extended >4 inches beyond site of sting.  Reviewed Schedule and no appointments available at office.  Offered to check other locations and patient refused [place] that she will go to a Urgent Care for evaluation.  Symptoms  Reason For Call & Symptoms: Yellow jacket sting to left hand  at 8pm with swelling to hand that is progressing to wrist and up arm  Reviewed Health History In EMR: Yes  Reviewed Medications In EMR: Yes  Reviewed Allergies In EMR: Yes  Reviewed Surgeries / Procedures: Yes  Date of Onset of Symptoms: 07/24/2014  Treatments Tried: Benadryl x2  Treatments Tried Worked: No OB / GYN:  LMP: 07/04/2014  Guideline(s) Used:  Bee Sting  Disposition Per Guideline:   See Today in Office  Reason For Disposition Reached:   Swelling is huge (e.g., > 4 inches or 10 cm, spreads beyond wrist or ankle)  Advice Given:  Hydrocortisone Cream for Itching:  Hydrocortisone cream applied to the sting area 4 times a day can also help reduce itching. Use it for a couple days until the itch is mild.  Patient Refused Recommendation:  Patient Will Go To [facility]  No appointments available will go to Urgent care for evaluation

## 2014-07-26 NOTE — Telephone Encounter (Signed)
Agree: use Benadryl, Tylenol. Advil and ice prn in place of Prednisone OV if not well Thx

## 2014-07-26 NOTE — Telephone Encounter (Signed)
Pt left vm today stating she was prescribed Prednisone by UC yesterday. Today she experienced severe leg aches and soreness. I advised her to hold prednisone until MD returns to office tomorrow AM and gives me further advisement. She states the sting area is much better today. C/o itching, but swelling has gone down. Please advise.

## 2014-07-27 NOTE — Telephone Encounter (Signed)
Called pt no answer LMOM with md response.../lmb 

## 2014-09-14 ENCOUNTER — Other Ambulatory Visit: Payer: Self-pay | Admitting: Obstetrics and Gynecology

## 2014-09-14 DIAGNOSIS — R928 Other abnormal and inconclusive findings on diagnostic imaging of breast: Secondary | ICD-10-CM

## 2014-09-20 ENCOUNTER — Ambulatory Visit
Admission: RE | Admit: 2014-09-20 | Discharge: 2014-09-20 | Disposition: A | Discharge status: Home / Self Care | Payer: 59 | Source: Ambulatory Visit | Attending: Obstetrics and Gynecology | Admitting: Obstetrics and Gynecology

## 2014-09-20 DIAGNOSIS — R928 Other abnormal and inconclusive findings on diagnostic imaging of breast: Secondary | ICD-10-CM

## 2015-03-21 ENCOUNTER — Other Ambulatory Visit: Payer: Self-pay | Admitting: Obstetrics and Gynecology

## 2015-03-21 DIAGNOSIS — R921 Mammographic calcification found on diagnostic imaging of breast: Secondary | ICD-10-CM

## 2015-04-01 ENCOUNTER — Ambulatory Visit
Admission: RE | Admit: 2015-04-01 | Discharge: 2015-04-01 | Disposition: A | Discharge status: Home / Self Care | Payer: 59 | Source: Ambulatory Visit | Attending: Obstetrics and Gynecology | Admitting: Obstetrics and Gynecology

## 2015-04-01 ENCOUNTER — Other Ambulatory Visit: Payer: Self-pay

## 2015-04-01 DIAGNOSIS — R921 Mammographic calcification found on diagnostic imaging of breast: Secondary | ICD-10-CM

## 2015-09-06 ENCOUNTER — Ambulatory Visit
Admission: RE | Admit: 2015-09-06 | Discharge: 2015-09-06 | Disposition: A | Discharge status: Home / Self Care | Payer: Managed Care, Other (non HMO) | Source: Ambulatory Visit | Attending: Obstetrics and Gynecology | Admitting: Obstetrics and Gynecology

## 2015-09-06 ENCOUNTER — Other Ambulatory Visit: Payer: Self-pay | Admitting: Obstetrics and Gynecology

## 2015-09-06 DIAGNOSIS — R921 Mammographic calcification found on diagnostic imaging of breast: Secondary | ICD-10-CM

## 2016-12-10 ENCOUNTER — Other Ambulatory Visit: Payer: Self-pay | Admitting: Obstetrics and Gynecology

## 2016-12-10 DIAGNOSIS — R921 Mammographic calcification found on diagnostic imaging of breast: Secondary | ICD-10-CM

## 2017-01-05 ENCOUNTER — Ambulatory Visit
Admission: RE | Admit: 2017-01-05 | Discharge: 2017-01-05 | Disposition: A | Discharge status: Home / Self Care | Payer: Managed Care, Other (non HMO) | Source: Ambulatory Visit | Attending: Obstetrics and Gynecology | Admitting: Obstetrics and Gynecology

## 2017-01-05 DIAGNOSIS — R921 Mammographic calcification found on diagnostic imaging of breast: Secondary | ICD-10-CM
